# Patient Record
Sex: Female | Born: 1995 | Hispanic: Yes | Marital: Married | Smoking: Never smoker
Health system: Southern US, Community
[De-identification: ages and names within clinical notes are randomized; demographics above are authoritative.]

## PROBLEM LIST (undated history)

## (undated) ENCOUNTER — Emergency Department (HOSPITAL_COMMUNITY): Payer: BC Managed Care – PPO

## (undated) DIAGNOSIS — Z789 Other specified health status: Secondary | ICD-10-CM

## (undated) HISTORY — PX: KNEE ARTHROSCOPY: SHX127

## (undated) HISTORY — PX: KNEE SURGERY: SHX244

---

## 2020-01-03 ENCOUNTER — Emergency Department (HOSPITAL_COMMUNITY): Payer: No Typology Code available for payment source

## 2020-01-03 ENCOUNTER — Encounter (HOSPITAL_COMMUNITY): Payer: Self-pay | Admitting: Emergency Medicine

## 2020-01-03 ENCOUNTER — Other Ambulatory Visit: Payer: Self-pay

## 2020-01-03 ENCOUNTER — Emergency Department (HOSPITAL_COMMUNITY)
Admission: EM | Admit: 2020-01-03 | Discharge: 2020-01-03 | Disposition: A | Discharge status: Home / Self Care | Payer: No Typology Code available for payment source | Attending: Emergency Medicine | Admitting: Emergency Medicine

## 2020-01-03 DIAGNOSIS — Y999 Unspecified external cause status: Secondary | ICD-10-CM | POA: Insufficient documentation

## 2020-01-03 DIAGNOSIS — S60212A Contusion of left wrist, initial encounter: Secondary | ICD-10-CM | POA: Insufficient documentation

## 2020-01-03 DIAGNOSIS — Y92410 Unspecified street and highway as the place of occurrence of the external cause: Secondary | ICD-10-CM | POA: Diagnosis not present

## 2020-01-03 DIAGNOSIS — Y939 Activity, unspecified: Secondary | ICD-10-CM | POA: Insufficient documentation

## 2020-01-03 DIAGNOSIS — T1490XA Injury, unspecified, initial encounter: Secondary | ICD-10-CM

## 2020-01-03 DIAGNOSIS — S6992XA Unspecified injury of left wrist, hand and finger(s), initial encounter: Secondary | ICD-10-CM | POA: Diagnosis present

## 2020-01-03 LAB — POC URINE PREG, ED: Preg Test, Ur: NEGATIVE
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
## 2020-01-03 NOTE — ED Notes (Signed)
An After Visit Summary was printed and given to the patient. Discharge instructions given and no further questions at this time.  
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
## 2021-11-05 ENCOUNTER — Encounter (HOSPITAL_COMMUNITY): Payer: Self-pay | Admitting: Emergency Medicine

## 2021-11-05 ENCOUNTER — Emergency Department (HOSPITAL_COMMUNITY)
Admission: EM | Admit: 2021-11-05 | Discharge: 2021-11-06 | Disposition: A | Discharge status: Home / Self Care | Payer: BC Managed Care – PPO | Attending: Emergency Medicine | Admitting: Emergency Medicine

## 2021-11-05 ENCOUNTER — Other Ambulatory Visit: Payer: Self-pay

## 2021-11-05 DIAGNOSIS — N200 Calculus of kidney: Secondary | ICD-10-CM | POA: Diagnosis not present

## 2021-11-05 DIAGNOSIS — R109 Unspecified abdominal pain: Secondary | ICD-10-CM | POA: Diagnosis present

## 2021-11-05 DIAGNOSIS — N12 Tubulo-interstitial nephritis, not specified as acute or chronic: Secondary | ICD-10-CM

## 2021-11-05 DIAGNOSIS — D72829 Elevated white blood cell count, unspecified: Secondary | ICD-10-CM | POA: Insufficient documentation

## 2021-11-05 LAB — CBC WITH DIFFERENTIAL/PLATELET
Abs Immature Granulocytes: 0.06 10*3/uL (ref 0.00–0.07)
Basophils Absolute: 0.1 10*3/uL (ref 0.0–0.1)
Basophils Relative: 0 %
Eosinophils Absolute: 0.2 10*3/uL (ref 0.0–0.5)
Eosinophils Relative: 1 %
HCT: 39.7 % (ref 36.0–46.0)
Hemoglobin: 13.3 g/dL (ref 12.0–15.0)
Immature Granulocytes: 0 %
Lymphocytes Relative: 29 %
Lymphs Abs: 4.3 10*3/uL — ABNORMAL HIGH (ref 0.7–4.0)
MCH: 31.7 pg (ref 26.0–34.0)
MCHC: 33.5 g/dL (ref 30.0–36.0)
MCV: 94.5 fL (ref 80.0–100.0)
Monocytes Absolute: 1.1 10*3/uL — ABNORMAL HIGH (ref 0.1–1.0)
Monocytes Relative: 7 %
Neutro Abs: 9.1 10*3/uL — ABNORMAL HIGH (ref 1.7–7.7)
Neutrophils Relative %: 63 %
Platelets: 210 10*3/uL (ref 150–400)
RBC: 4.2 MIL/uL (ref 3.87–5.11)
RDW: 13.6 % (ref 11.5–15.5)
WBC: 14.8 10*3/uL — ABNORMAL HIGH (ref 4.0–10.5)
nRBC: 0 % (ref 0.0–0.2)

## 2021-11-05 LAB — COMPREHENSIVE METABOLIC PANEL
ALT: 15 U/L (ref 0–44)
AST: 17 U/L (ref 15–41)
Albumin: 4.6 g/dL (ref 3.5–5.0)
Alkaline Phosphatase: 51 U/L (ref 38–126)
Anion gap: 11 (ref 5–15)
BUN: 10 mg/dL (ref 6–20)
CO2: 24 mmol/L (ref 22–32)
Calcium: 9.7 mg/dL (ref 8.9–10.3)
Chloride: 106 mmol/L (ref 98–111)
Creatinine, Ser: 0.82 mg/dL (ref 0.44–1.00)
GFR, Estimated: >60 mL/min (ref >60)
Glucose, Bld: 93 mg/dL (ref 70–99)
Potassium: 4.2 mmol/L (ref 3.5–5.1)
Sodium: 141 mmol/L (ref 135–145)
Total Bilirubin: 0.5 mg/dL (ref 0.3–1.2)
Total Protein: 7.3 g/dL (ref 6.5–8.1)

## 2021-11-05 LAB — URINALYSIS, ROUTINE W REFLEX MICROSCOPIC
Bilirubin Urine: NEGATIVE
Glucose, UA: NEGATIVE mg/dL
Ketones, ur: NEGATIVE mg/dL
Nitrite: NEGATIVE
Protein, ur: NEGATIVE mg/dL
Specific Gravity, Urine: 1.002 — ABNORMAL LOW (ref 1.005–1.030)
pH: 7 (ref 5.0–8.0)

## 2021-11-05 LAB — I-STAT BETA HCG BLOOD, ED (MC, WL, AP ONLY): I-stat hCG, quantitative: <5 m[IU]/mL (ref <5)

## 2021-11-05 NOTE — ED Triage Notes (Signed)
Patient reports left lower back pain onset today , denies hematuria or dysuria , currently taking oral antibiotic for UTI.

## 2021-11-05 NOTE — ED Provider Triage Note (Signed)
Emergency Medicine Provider Triage Evaluation Note  Marie Weaver , a 26 y.o. female  was evaluated in triage.  Pt complains of left flank pain today, intermittent, sharp, severe at times with associated chills. Has also had urinary frequency/urgency x 1 week, prescribed abx for UTI (macrobid) picked this up today.  Review of Systems  Positive: Chills, flank pain, urgency, frequency, currently menstruating Negative: Vomiting   Physical Exam  BP (!) 140/104 (BP Location: Right Arm)   Pulse 99   Temp 98.3 F (36.8 C) (Oral)   Resp 16   LMP 11/02/2021   SpO2 100%  Gen:   Awake, no distress   Resp:  Normal effort  MSK:   Moves extremities without difficulty  Other:  Left CVA tenderness.   Medical Decision Making  Medically screening exam initiated at 10:56 PM.  Appropriate orders placed.  Oakleigh Mancillas was informed that the remainder of the evaluation will be completed by another provider, this initial triage assessment does not replace that evaluation, and the importance of remaining in the ED until their evaluation is complete.  Flank pain   Cherly Anderson, PA-C 11/05/21 2257

## 2021-11-06 ENCOUNTER — Emergency Department (HOSPITAL_COMMUNITY): Payer: BC Managed Care – PPO

## 2021-11-06 LAB — URINE CULTURE: Culture: <10000 — AB

## 2021-11-06 MED ORDER — ONDANSETRON 4 MG PO TBDP: 4.0000 mg | ORAL_TABLET | Freq: Three times a day (TID) | ORAL | 0 refills | Status: DC | PRN

## 2021-11-06 MED ORDER — KETOROLAC TROMETHAMINE 15 MG/ML IJ SOLN
15.0000 mg | Freq: Once | INTRAMUSCULAR | Status: AC
  Administered 2021-11-06: 15 mg via INTRAVENOUS
  Filled 2021-11-06: qty 1

## 2021-11-06 MED ORDER — SODIUM CHLORIDE 0.9 % IV SOLN
1.0000 g | Freq: Once | INTRAVENOUS | Status: AC
  Administered 2021-11-06: 1 g via INTRAVENOUS
  Filled 2021-11-06: qty 10

## 2021-11-06 MED ORDER — NAPROXEN 500 MG PO TABS: 500.0000 mg | ORAL_TABLET | Freq: Two times a day (BID) | ORAL | 0 refills | Status: DC | PRN

## 2021-11-06 MED ORDER — CEFPODOXIME PROXETIL 200 MG PO TABS: 200.0000 mg | ORAL_TABLET | Freq: Two times a day (BID) | ORAL | 0 refills | Status: DC

## 2021-11-06 NOTE — Discharge Instructions (Addendum)
You were seen in the emergency department today for left-sided back pain.  We suspect that you have pyelonephritis, please see attached handout for further information.  Your blood work showed that your white blood cell count was high which could indicate infection, your urine had findings of infection.  Your CT scan did not show a kidney stone, you do have some findings that could indicate constipation, take MiraLAX as needed for constipation, your CT scan also showed some slight disc bulging at L4-L5, please discuss with your primary care provider.  We are sending home with the following medicines: - Cefpodoxime: This is an antibiotic, please take as prescribed  Do not take the Macrobid with the cefpodoxime, please discontinue the Macrobid.  -Zofran: Take every hours as needed for nausea and vomiting  - Naproxen: this is a nonsteroidal anti-inflammatory medication that will help with pain and swelling. Be sure to take this medication as prescribed with food, 1 pill every 12 hours,  It should be taken with food, as it can cause stomach upset, and more seriously, stomach bleeding. Do not take other nonsteroidal anti-inflammatory medications with this such as Advil, Motrin, Aleve, Mobic, Goodie Powder, or Motrin etc..    You make take Tylenol per over the counter dosing with these medications.   We have prescribed you new medication(s) today. Discuss the medications prescribed today with your pharmacist as they can have adverse effects and interactions with your other medicines including over the counter and prescribed medications. Seek medical evaluation if you start to experience new or abnormal symptoms after taking one of these medicines, seek care immediately if you start to experience difficulty breathing, feeling of your throat closing, facial swelling, or rash as these could be indications of a more serious allergic reaction   We have cultured your urine, we will call you if the cefpodoxime  does not cover for your infection.  Please follow-up with your primary care provider within 3 days.  Return to the ER for any new or worsening symptoms including but not limited to new or worsening pain, fever, inability to keep fluids down, or any other concerns

## 2021-11-06 NOTE — ED Notes (Signed)
Patient verbalizes understanding of discharge instructions. Opportunity for questioning and answers were provided. Armband removed by staff, pt discharged from ED and ambulated to lobby to return home with SO.  

## 2021-11-06 NOTE — ED Provider Notes (Signed)
[facility] EMERGENCY DEPARTMENT Provider Note   CSN: [id number] Arrival date & time: 11/05/21  2229     History  Chief Complaint  Patient presents with   Back Pain    UTI    Marie Weaver is a 26 y.o. female without significant pmhx who presents to the ED with complaints of left flank pain that began earlier today, sharp, severe at times, waxing/waning, no alleviating factors. Also has had urgency/frequency x 1 week, evaluated and found to have UTI- received prescription for macrobid which she picked up today. Has had some chills as well. Denies fever, vomiting, diarrhea, or vaginal discharge. Currently menstruating.   HPI     Home Medications Prior to Admission medications   Not on File      Allergies    Patient has no known allergies.    Review of Systems   Review of Systems  Constitutional:  Negative for chills and fever.  Respiratory:  Negative for shortness of breath.   Cardiovascular:  Negative for chest pain.  Gastrointestinal:  Negative for abdominal pain, diarrhea, nausea and vomiting.  Genitourinary:  Positive for frequency, urgency and vaginal bleeding (menses). Negative for dysuria and vaginal discharge.  All other systems reviewed and are negative.   Physical Exam Updated Vital Signs BP (!) 136/100   Pulse 100   Temp 98.1 F (36.7 C)   Resp 17   LMP 11/02/2021   SpO2 100%  Physical Exam Vitals and nursing note reviewed.  Constitutional:      General: She is not in acute distress.    Appearance: She is well-developed. She is not toxic-appearing.  HENT:     Head: Normocephalic and atraumatic.  Eyes:     General:        Right eye: No discharge.        Left eye: No discharge.     Conjunctiva/sclera: Conjunctivae normal.  Cardiovascular:     Rate and Rhythm: Normal rate and regular rhythm.  Pulmonary:     Effort: No respiratory distress.     Breath sounds: Normal breath sounds. No wheezing or rales.  Abdominal:      General: There is no distension.     Palpations: Abdomen is soft.     Tenderness: There is no abdominal tenderness. There is left CVA tenderness. There is no right CVA tenderness, guarding or rebound.  Musculoskeletal:     Cervical back: Neck supple.  Skin:    General: Skin is warm and dry.  Neurological:     Mental Status: She is alert.     Comments: Clear speech. Strength & sensation grossly intact to bilateral lower extremities.   Psychiatric:        Behavior: Behavior normal.     ED Results / Procedures / Treatments   Labs (all labs ordered are listed, but only abnormal results are displayed) Labs Reviewed  CBC WITH DIFFERENTIAL/PLATELET - Abnormal; Notable for the following components:      Result Value   WBC 14.8 (*)    Neutro Abs 9.1 (*)    Lymphs Abs 4.3 (*)    Monocytes Absolute 1.1 (*)    All other components within normal limits  URINALYSIS, ROUTINE W REFLEX MICROSCOPIC - Abnormal; Notable for the following components:   Color, Urine STRAW (*)    APPearance HAZY (*)    Specific Gravity, Urine 1.002 (*)    Hgb urine dipstick LARGE (*)    Leukocytes,Ua LARGE (*)    Bacteria, UA  FEW (*)    All other components within normal limits  URINE CULTURE  COMPREHENSIVE METABOLIC PANEL  I-STAT BETA HCG BLOOD, ED ([facility], [facility], [facility] ONLY)    EKG None  Radiology CT Renal Stone Study  Result Date: 11/06/2021 CLINICAL DATA:  Flank pain, kidney stone suspected EXAM: CT ABDOMEN AND PELVIS WITHOUT CONTRAST TECHNIQUE: Multidetector CT imaging of the abdomen and pelvis was performed following the standard protocol without IV contrast. RADIATION DOSE REDUCTION: This exam was performed according to the departmental dose-optimization program which includes automated exposure control, adjustment of the mA and/or kV according to patient size and/or use of iterative reconstruction technique. COMPARISON:  None Available. FINDINGS: Lower chest: No acute airspace disease or pleural effusion.  Hepatobiliary: Subcentimeter fat density structure in the posterior right hepatic lobe, of doubtful clinical significance. No suspicious liver lesion. The gallbladder is decompressed. No calcified gallstone. No biliary dilatation. Pancreas: Unremarkable, portions are obscured due to noncontrast technique Spleen: Normal in size without focal abnormality. Adrenals/Urinary Tract: No adrenal nodule. No hydronephrosis or renal calculi. No perinephric edema. No evidence of focal renal lesion on this unenhanced exam. Both ureters are decompressed without stones along the course. There are multiple phleboliths in the pelvis that are outside the course of the ureter. The urinary bladder is partially distended, no bladder stone or wall thickening. Stomach/Bowel: Ingested material distends the stomach. There is no bowel obstruction or inflammation. Normal appendix visualized. Mild fecalization of distal small bowel contents. Moderate volume of stool in the colon. No colonic inflammation. No abnormal rectal distention. Vascular/Lymphatic: Normal caliber abdominal aorta. There is no bulky abdominopelvic adenopathy on this unenhanced exam. Reproductive: Anteverted uterus. The ovaries are symmetric in size, no adnexal mass. Small volume simple free fluid in the dependent pelvis. Other: Small volume pelvic free fluid, typically physiologic in a patient of this age. No upper abdominal ascites. No free air or focal fluid collection. Musculoskeletal: There are no acute or suspicious osseous abnormalities. Slight L4-S5 disc bulge. IMPRESSION: 1. No renal stones or obstructive uropathy. 2. Moderate colonic stool burden with fecalization of distal small bowel contents, suggesting slow transit/constipation. 3. Slight L4-L5 disc bulge Electronically Signed   By: Narda Rutherford M.D.   On: 11/06/2021 00:28    Procedures Procedures    Medications Ordered in ED Medications  cefTRIAXone (ROCEPHIN) 1 g in sodium chloride 0.9 % 100 mL  IVPB (1 g Intravenous New Bag/Given 11/06/21 0313)  ketorolac (TORADOL) 15 MG/ML injection 15 mg (15 mg Intravenous Given 11/06/21 9381)    ED Course/ Medical Decision Making/ A&P                           Medical Decision Making Amount and/or Complexity of Data Reviewed Labs: ordered. Radiology: ordered.  Risk Prescription drug management.   Patient presents to the ED with complaints of flank pain, this involves an extensive number of treatment options, and is a complaint that carries with it a high risk of complications and morbidity. Nontoxic, vitals w/ mildly elevated BP on arrival doubt HTN emergency. Abdomen nontender w/o peritoneal signs. Left CVA tenderness. No neuro deficits.   Ddx including but not limited to: pyelonephritis, kidney stone, MSK pain, dissection, torsion, ectopic pregnancy.   Additional history obtained:  Chart/nursing notes reviewed.   Lab Tests:  I viewed & interpreted labs including:  CBC: leukocytosis CMP: unremarkable.  UA: Concerning for infection- sent for culture Pregnancy test: Negative  Imaging Studies:  I ordered and  viewed the following imaging, agree with radiologist impression:  CT renal stone study: 1. No renal stones or obstructive uropathy. 2. Moderate colonic stool burden with fecalization of distal small bowel contents, suggesting slow transit/constipation. 3. Slight L4-L5 disc   ED Course:  I ordered medications including Rocephin to tx for pyelonephritis and toradol for pain.   Patient is feeling improved on re-assessment, appears appropriate for discharge. Stop macrobid, start cefpodoxime, additional supportive care provided. I discussed results, treatment plan, need for follow-up, and return precautions with the patient. Provided opportunity for questions, patient confirmed understanding and is in agreement with plan.    Portions of this note were generated with Lobbyist. Dictation errors may occur despite best  attempts at proofreading.   Final Clinical Impression(s) / ED Diagnoses Final diagnoses:  Pyelonephritis    Rx / DC Orders ED Discharge Orders          Ordered    cefpodoxime (VANTIN) 200 MG tablet  2 times daily        11/06/21 0345    naproxen (NAPROSYN) 500 MG tablet  2 times daily PRN        11/06/21 0345    ondansetron (ZOFRAN-ODT) 4 MG disintegrating tablet  Every 8 hours PRN        11/06/21 0345              Nestor Wieneke, Glynda Jaeger, PA-C 11/06/21 0405    Orpah Greek, MD 11/06/21 [id number]

## 2021-11-20 DIAGNOSIS — M545 Low back pain, unspecified: Secondary | ICD-10-CM | POA: Diagnosis not present

## 2021-11-20 DIAGNOSIS — M25551 Pain in right hip: Secondary | ICD-10-CM | POA: Insufficient documentation

## 2021-11-20 DIAGNOSIS — Z5321 Procedure and treatment not carried out due to patient leaving prior to being seen by health care provider: Secondary | ICD-10-CM | POA: Insufficient documentation

## 2021-11-21 ENCOUNTER — Other Ambulatory Visit: Payer: Self-pay

## 2021-11-21 ENCOUNTER — Emergency Department (HOSPITAL_COMMUNITY)
Admission: EM | Admit: 2021-11-21 | Discharge: 2021-11-21 | Discharge status: Against Medical Advice | Payer: BC Managed Care – PPO | Attending: Emergency Medicine | Admitting: Emergency Medicine

## 2021-11-21 ENCOUNTER — Encounter (HOSPITAL_COMMUNITY): Payer: Self-pay

## 2021-11-21 LAB — URINALYSIS, ROUTINE W REFLEX MICROSCOPIC
Bacteria, UA: NONE SEEN
Bilirubin Urine: NEGATIVE
Glucose, UA: NEGATIVE mg/dL
Hgb urine dipstick: NEGATIVE
Ketones, ur: NEGATIVE mg/dL
Leukocytes,Ua: NEGATIVE
Nitrite: NEGATIVE
Protein, ur: NEGATIVE mg/dL
Specific Gravity, Urine: 1.019 (ref 1.005–1.030)
pH: 7 (ref 5.0–8.0)

## 2021-11-21 LAB — PREGNANCY, URINE: Preg Test, Ur: NEGATIVE

## 2021-11-21 MED ORDER — OXYCODONE-ACETAMINOPHEN 5-325 MG PO TABS
1.0000 | ORAL_TABLET | Freq: Once | ORAL | Status: AC
  Administered 2021-11-21: 1 via ORAL
  Filled 2021-11-21: qty 1

## 2021-11-21 NOTE — ED Notes (Signed)
Pt left building, stated it was taking too long to be seen.

## 2021-11-21 NOTE — ED Triage Notes (Signed)
Lower back pain beginning today. Worse with sudden movements.   Denies dysuria.

## 2021-11-21 NOTE — ED Provider Triage Note (Signed)
  Emergency Medicine Provider Triage Evaluation Note  MRN:  [id number]  Arrival date & time: 11/21/21    Medically screening exam initiated at 12:14 AM.   CC:   Back Pain   HPI:  Marie Weaver is a 26 y.o. year-old female presents to the ED with chief complaint of low back pain for the past few days.  Reports some radiating pain into the right hip.  Denies urinary symptoms.  Recent CT shows L4/5 disc bulge.  She denies any trauma or injury.  Symptoms are worsened with movement and palpation.  History provided by patient. ROS:  -As included in HPI PE:   Vitals:   11/21/21 0011  BP: 109/88  Pulse: 77  Resp: 16  Temp: 98.7 F (37.1 C)  SpO2: 99%    Non-toxic appearing No respiratory distress  MDM:  Based on signs and symptoms, herniated disc is highest on my differential, followed by muscle strain. I've ordered UA in triage to expedite lab/diagnostic workup.  Patient was informed that the remainder of the evaluation will be completed by another provider, this initial triage assessment does not replace that evaluation, and the importance of remaining in the ED until their evaluation is complete.    Roxy Horseman, PA-C 11/21/21 0015

## 2021-12-15 ENCOUNTER — Other Ambulatory Visit: Payer: Self-pay

## 2021-12-15 ENCOUNTER — Encounter (HOSPITAL_COMMUNITY): Payer: Self-pay | Admitting: Obstetrics & Gynecology

## 2021-12-15 ENCOUNTER — Inpatient Hospital Stay (HOSPITAL_COMMUNITY): Payer: BC Managed Care – PPO

## 2021-12-15 ENCOUNTER — Inpatient Hospital Stay (HOSPITAL_COMMUNITY)
Admission: AD | Admit: 2021-12-15 | Discharge: 2021-12-15 | Disposition: A | Discharge status: Home / Self Care | Payer: BC Managed Care – PPO | Attending: Obstetrics & Gynecology | Admitting: Obstetrics & Gynecology

## 2021-12-15 DIAGNOSIS — O3680X0 Pregnancy with inconclusive fetal viability, not applicable or unspecified: Secondary | ICD-10-CM | POA: Diagnosis not present

## 2021-12-15 DIAGNOSIS — Z3A01 Less than 8 weeks gestation of pregnancy: Secondary | ICD-10-CM | POA: Insufficient documentation

## 2021-12-15 DIAGNOSIS — O26891 Other specified pregnancy related conditions, first trimester: Secondary | ICD-10-CM | POA: Diagnosis present

## 2021-12-15 DIAGNOSIS — O209 Hemorrhage in early pregnancy, unspecified: Secondary | ICD-10-CM | POA: Diagnosis not present

## 2021-12-15 HISTORY — DX: Other specified health status: Z78.9

## 2021-12-15 LAB — URINALYSIS, ROUTINE W REFLEX MICROSCOPIC
Bilirubin Urine: NEGATIVE
Glucose, UA: NEGATIVE mg/dL
Ketones, ur: 5 mg/dL — AB
Leukocytes,Ua: NEGATIVE
Nitrite: NEGATIVE
Protein, ur: 30 mg/dL — AB
Specific Gravity, Urine: 1.008 (ref 1.005–1.030)
pH: 6 (ref 5.0–8.0)

## 2021-12-15 LAB — CBC
HCT: 38.3 % (ref 36.0–46.0)
Hemoglobin: 13.1 g/dL (ref 12.0–15.0)
MCH: 31.3 pg (ref 26.0–34.0)
MCHC: 34.2 g/dL (ref 30.0–36.0)
MCV: 91.6 fL (ref 80.0–100.0)
Platelets: 209 10*3/uL (ref 150–400)
RBC: 4.18 MIL/uL (ref 3.87–5.11)
RDW: 13.4 % (ref 11.5–15.5)
WBC: 11.8 10*3/uL — ABNORMAL HIGH (ref 4.0–10.5)
nRBC: 0 % (ref 0.0–0.2)

## 2021-12-15 LAB — POCT PREGNANCY, URINE: Preg Test, Ur: POSITIVE — AB

## 2021-12-15 LAB — HCG, QUANTITATIVE, PREGNANCY: hCG, Beta Chain, Quant, S: 193 m[IU]/mL — ABNORMAL HIGH (ref <5)

## 2021-12-15 NOTE — MAU Note (Signed)
Marie Weaver is a 26 y.o. at [redacted]w[redacted]d here in MAU reporting: started having brown discharge yesterday. Started cramping this morning and then saw bleeding. States bleeding is now like a period and is wearing a pad.   LMP: 11/01/2021  Onset of complaint: yesterday  Pain score: 4/10  Vitals:   12/15/21 1310  BP: (!) 145/79  Pulse: 95  Resp: 16  Temp: 98.2 F (36.8 C)  SpO2: 99%     Lab orders placed from triage: ua, upt

## 2021-12-15 NOTE — MAU Provider Note (Signed)
History     CSN: [id number]  Arrival date and time: 12/15/21 1246   Event Date/Time   First Provider Initiated Contact with Patient 12/15/21 1457      Chief Complaint  Patient presents with   Vaginal Bleeding   Abdominal Pain   HPI Marie Weaver is a 26 y.o. G1P0 at [redacted]w[redacted]d by LMP who presents to MAU with chief complaints of abdominal pain and vaginal bleeding. These are new problems, onset yesterday 12/14/2021. Patient initially noted brown-tinged discharge, which turned to frank red bleeding this morning. Pain is suprapubic, pain score 4/10. She denies aggravating or alleviating factors. She denies dysuria, abdominal tenderness, fever or recent illness. She is remote from sexual intercourse.  OB History     Gravida  1   Para      Term      Preterm      AB      Living         SAB      IAB      Ectopic      Multiple      Live Births              Past Medical History:  Diagnosis Date   Medical history non-contributory     Past Surgical History:  Procedure Laterality Date   KNEE ARTHROSCOPY     KNEE SURGERY      No family history on file.  Social History   Tobacco Use   Smoking status: Never   Smokeless tobacco: Never  Substance Use Topics   Alcohol use: Never   Drug use: Never    Allergies: No Known Allergies  Medications Prior to Admission  Medication Sig Dispense Refill Last Dose   Prenatal Vit-Fe Fumarate-FA (PRENATAL MULTIVITAMIN) TABS tablet Take 1 tablet by mouth daily at 12 noon.      cefpodoxime (VANTIN) 200 MG tablet Take 1 tablet (200 mg total) by mouth 2 (two) times daily. (Patient not taking: Reported on 12/15/2021) 20 tablet 0 Not Taking   naproxen (NAPROSYN) 500 MG tablet Take 1 tablet (500 mg total) by mouth 2 (two) times daily as needed for moderate pain. (Patient not taking: Reported on 12/15/2021) 15 tablet 0 Not Taking   ondansetron (ZOFRAN-ODT) 4 MG disintegrating tablet Take 1 tablet (4 mg total) by mouth every 8 (eight)  hours as needed for nausea or vomiting. (Patient not taking: Reported on 12/15/2021) 5 tablet 0 Not Taking    Review of Systems  Gastrointestinal:  Positive for abdominal pain.  Genitourinary:  Positive for vaginal bleeding.  All other systems reviewed and are negative.  Physical Exam   Blood pressure (!) 145/79, pulse 95, temperature 98.2 F (36.8 C), temperature source Oral, resp. rate 16, height 5\' 2"  (1.575 m), weight 56.5 kg, last menstrual period 11/01/2021, SpO2 99 %.  Physical Exam Vitals and nursing note reviewed. Exam conducted with a chaperone present.  Constitutional:      Appearance: She is well-developed.  Cardiovascular:     Rate and Rhythm: Normal rate.     Heart sounds: Normal heart sounds.  Pulmonary:     Effort: Pulmonary effort is normal.  Abdominal:     General: Abdomen is flat.     Palpations: Abdomen is soft.  Genitourinary:    Comments: Deferred Skin:    Capillary Refill: Capillary refill takes less than 2 seconds.  Neurological:     Mental Status: She is alert and oriented to person, place, and time.  Psychiatric:  Mood and Affect: Mood normal.        Behavior: Behavior normal.     MAU Course  Procedures  MDM  --Patient with documented negative UPT 11/21/2021 --Discussed expected changes in quant hCG for miscarriage vs ectopic vs viable pregnancy  Orders Placed This Encounter  Procedures   US OB LESS THAN 14 WEEKS WITH OB TRANSVAGINAL   Urinalysis, Routine w reflex microscopic Urine, Clean Catch   CBC   hCG, quantitative, pregnancy   Pregnancy, urine POC   ABO/Rh   Discharge patient   Patient Vitals for the past 24 hrs:  BP Temp Temp src Pulse Resp SpO2 Height Weight  12/15/21 1526 128/77 -- -- 88 18 -- -- --  12/15/21 1310 (!) 145/79 98.2 F (36.8 C) Oral 95 16 99 % -- --  12/15/21 1306 -- -- -- -- -- -- 5\' 2"  (1.575 m) 56.5 kg   Results for orders placed or performed during the hospital encounter of 12/15/21 (from the past  24 hour(s))  Pregnancy, urine POC     Status: Abnormal   Collection Time: 12/15/21 12:55 PM  Result Value Ref Range   Preg Test, Ur POSITIVE (A) NEGATIVE  Urinalysis, Routine w reflex microscopic Urine, Clean Catch     Status: Abnormal   Collection Time: 12/15/21  1:01 PM  Result Value Ref Range   Color, Urine YELLOW YELLOW   APPearance HAZY (A) CLEAR   Specific Gravity, Urine 1.008 1.005 - 1.030   pH 6.0 5.0 - 8.0   Glucose, UA NEGATIVE NEGATIVE mg/dL   Hgb urine dipstick LARGE (A) NEGATIVE   Bilirubin Urine NEGATIVE NEGATIVE   Ketones, ur 5 (A) NEGATIVE mg/dL   Protein, ur 30 (A) NEGATIVE mg/dL   Nitrite NEGATIVE NEGATIVE   Leukocytes,Ua NEGATIVE NEGATIVE   RBC / HPF 11-20 0 - 5 RBC/hpf   WBC, UA 0-5 0 - 5 WBC/hpf   Bacteria, UA RARE (A) NONE SEEN   Squamous Epithelial / LPF 0-5 0 - 5   Mucus PRESENT   CBC     Status: Abnormal   Collection Time: 12/15/21  1:39 PM  Result Value Ref Range   WBC 11.8 (H) 4.0 - 10.5 K/uL   RBC 4.18 3.87 - 5.11 MIL/uL   Hemoglobin 13.1 12.0 - 15.0 g/dL   HCT 12/17/21 42.6 - 83.4 %   MCV 91.6 80.0 - 100.0 fL   MCH 31.3 26.0 - 34.0 pg   MCHC 34.2 30.0 - 36.0 g/dL   RDW 19.6 22.2 - 97.9 %   Platelets 209 150 - 400 K/uL   nRBC 0.0 0.0 - 0.2 %  hCG, quantitative, pregnancy     Status: Abnormal   Collection Time: 12/15/21  1:39 PM  Result Value Ref Range   hCG, Beta Chain, Quant, S 193 (H) <5 mIU/mL  ABO/Rh     Status: None   Collection Time: 12/15/21  1:39 PM  Result Value Ref Range   ABO/RH(D)      O POS Performed at [facility], [address], [place], [place]    [address] OB LESS THAN 14 WEEKS WITH US TRANSVAGINAL  Result Date: 12/15/2021 CLINICAL DATA:  Pregnant patient. Brown discharge for a day. Bleeding. EXAM: OBSTETRIC <14 WK 12/17/2021 AND TRANSVAGINAL OB US TECHNIQUE: Both transabdominal and transvaginal ultrasound examinations were performed for complete evaluation of the gestation as well as the maternal uterus, adnexal regions,  and pelvic cul-de-sac. Transvaginal technique was performed to assess early pregnancy.  COMPARISON:  None Available. FINDINGS: Intrauterine gestational sac: None Maternal uterus/adnexae: The ovaries are normal in appearance. IMPRESSION: 1. No IUP identified. The lack of an IUP in the setting of a positive pregnancy test may represent ectopic pregnancy, early pregnancy, or recent miscarriage. Recommend clinical correlation and close imaging and lab follow-up as clinically warranted. Electronically Signed   By: Gerome Sam III M.D.   On: 12/15/2021 14:26     Assessment and Plan  --26 y.o. G1P0 with pregnancy of unknown location --Quant hCG 193 --Hgb 13.1 -- Rh + --Discharge home in stable condition with bleeding/ectopic precautions  F/U: Appt made for repeat stat Quant hCG at [facility] Wednesday morning 12/18/2021  Calvert Cantor, MSA, MSN, CNM 12/15/2021, 4:37 PM

## 2021-12-16 LAB — ABO/RH: ABO/RH(D): O POS

## 2021-12-18 ENCOUNTER — Ambulatory Visit (INDEPENDENT_AMBULATORY_CARE_PROVIDER_SITE_OTHER): Payer: BC Managed Care – PPO

## 2021-12-18 ENCOUNTER — Other Ambulatory Visit: Payer: Self-pay

## 2021-12-18 VITALS — BP 134/83 | HR 83 | Ht 62.0 in | Wt 124.9 lb

## 2021-12-18 DIAGNOSIS — O3680X0 Pregnancy with inconclusive fetal viability, not applicable or unspecified: Secondary | ICD-10-CM

## 2021-12-18 LAB — BETA HCG QUANT (REF LAB): hCG Quant: 10 m[IU]/mL

## 2021-12-18 NOTE — Progress Notes (Signed)
Beta HCG Follow-up Visit  Marie Weaver presents to [facility] for follow-up beta HCG lab. She was seen in MAU for abdominal pain and vaginal bleeding on 12/15/2021. Patient denies pain, bleeding today. Only light brown discharge. Discussed with patient that we are following beta HCG levels today. Results will be back in approximately 2-3 hours. Valid contact number for patient confirmed. I will call the patient with results. Pt verbalized understanding and agreeable to plan of care.   Beta HCG results: 8/27 193  8/30 10      Results and patient history reviewed with Dr Crissie Reese, who states has failed pregnancy and to follow beta down to 0. Needs non-stat beta in 1 week. Patient called and informed of plan for follow-up. Call placed to pt. Spoke with patient. Pt given results and recommendations per Dr Crissie Reese. Pt verbalized understanding. Pt scheduled for non-stat beta on 9/6 at 0820am. Pt agreeable to date and time of appt. Pt also has follow up appt with Dr Langston Masker at [facility] on 12/26/21. Advised pt to keep this appt. Pt agreeable.  [name] 12/18/2021 8:37 AM

## 2021-12-24 ENCOUNTER — Other Ambulatory Visit: Payer: Self-pay

## 2021-12-24 DIAGNOSIS — O039 Complete or unspecified spontaneous abortion without complication: Secondary | ICD-10-CM

## 2021-12-25 ENCOUNTER — Other Ambulatory Visit: Payer: Self-pay

## 2021-12-25 ENCOUNTER — Other Ambulatory Visit: Payer: BC Managed Care – PPO

## 2021-12-25 DIAGNOSIS — O039 Complete or unspecified spontaneous abortion without complication: Secondary | ICD-10-CM

## 2021-12-28 LAB — BETA HCG QUANT (REF LAB): hCG Quant: <1 m[IU]/mL

## 2023-12-11 ENCOUNTER — Ambulatory Visit: Admission: EM | Admit: 2023-12-11 | Discharge: 2023-12-11 | Disposition: A | Discharge status: Home / Self Care

## 2023-12-11 ENCOUNTER — Encounter: Payer: Self-pay | Admitting: Emergency Medicine

## 2023-12-11 DIAGNOSIS — T2016XA Burn of first degree of forehead and cheek, initial encounter: Secondary | ICD-10-CM

## 2023-12-11 NOTE — ED Triage Notes (Signed)
 Pt has 1in burn to forehead that occurred yesterday around 7pm. Pt walked into mosquito bug zapper on a porch. Forehead had contact for just a second before she jumped back. Sharp pain at initial injury, but now just feels like burning discomfort. Had a headache this morning that resolved on it's own. Applied burn neosporin to area last night.

## 2023-12-11 NOTE — ED Provider Notes (Signed)
 [facility]    CSN: [id number] Arrival date & time: 12/11/23  9182      History   Chief Complaint Chief Complaint  Patient presents with   Facial Burn    HPI Marie Weaver is a 28 y.o. female.   Discussed the use of AI scribe software for clinical note transcription with the patient, who gave verbal consent to proceed.   The patient presents with a burn injury to the face. The incident occurred last night when the patient accidentally ran into a mosquito-repellent stick while being chased by her dog. The burn is located on the patient's forehead. The patient reports that the main area of concern did not blister. The patient applied Neosporin specifically for burns to the affected area. She was initially concerned about the severity of the burn, noting that she saw white on the skin immediately after the incident, which prompted consideration of an ER visit. The patient opted for Urgent Care instead. No other symptoms or concerns were reported.  The following portions of the patient's history were reviewed and updated as appropriate: allergies, current medications, past family history, past medical history, past social history, past surgical history, and problem list.      Past Medical History:  Diagnosis Date   Medical history non-contributory     There are no active problems to display for this patient.   Past Surgical History:  Procedure Laterality Date   KNEE ARTHROSCOPY     KNEE SURGERY      OB History     Gravida  1   Para      Term      Preterm      AB      Living         SAB      IAB      Ectopic      Multiple      Live Births               Home Medications    Prior to Admission medications   Not on File    Family History History reviewed. No pertinent family history.  Social History Social History   Tobacco Use   Smoking status: Never   Smokeless tobacco: Never  Vaping Use   Vaping status: Never Used   Substance Use Topics   Alcohol use: Never   Drug use: Never     Allergies   Patient has no known allergies.   Review of Systems Review of Systems  Skin:  Positive for wound.  All other systems reviewed and are negative.    Physical Exam Triage Vital Signs ED Triage Vitals [12/11/23 1042]  Encounter Vitals Group     BP 127/81     Girls Systolic BP Percentile      Girls Diastolic BP Percentile      Boys Systolic BP Percentile      Boys Diastolic BP Percentile      Pulse Rate 60     Resp 14     Temp 98.2 F (36.8 C)     Temp Source Oral     SpO2 98 %     Weight      Height      Head Circumference      Peak Flow      Pain Score 5     Pain Loc      Pain Education      Exclude from Growth Chart    No  data found.  Updated Vital Signs BP 127/81 (BP Location: Left Arm)   Pulse 60   Temp 98.2 F (36.8 C) (Oral)   Resp 14   LMP 12/08/2023 (Exact Date)   SpO2 98%   Breastfeeding No   Visual Acuity Right Eye Distance:   Left Eye Distance:   Bilateral Distance:    Right Eye Near:   Left Eye Near:    Bilateral Near:     Physical Exam Vitals reviewed.  Constitutional:      General: She is awake. She is not in acute distress.    Appearance: Normal appearance. She is well-developed. She is not ill-appearing, toxic-appearing or diaphoretic.  HENT:     Head: Normocephalic.     Right Ear: Hearing normal.     Left Ear: Hearing normal.     Nose: Nose normal.     Mouth/Throat:     Mouth: Mucous membranes are moist.  Eyes:     General: Vision grossly intact.     Conjunctiva/sclera: Conjunctivae normal.  Cardiovascular:     Rate and Rhythm: Normal rate and regular rhythm.     Heart sounds: Normal heart sounds.  Pulmonary:     Effort: Pulmonary effort is normal.     Breath sounds: Normal breath sounds and air entry.  Musculoskeletal:        General: Normal range of motion.     Cervical back: Full passive range of motion without pain, normal range of motion  and neck supple.  Skin:    General: Skin is warm and dry.     Findings: Burn present.     Comments: Mildly erythematous area noted to the forehead consistent with a superficial burn. There is no surrounding swelling, blistering, or drainage, and the skin surface appears dry without evidence of moistness. The area is minimally tender to palpation, with no signs of secondary infection (see image below)   Neurological:     General: No focal deficit present.     Mental Status: She is alert and oriented to person, place, and time.  Psychiatric:        Speech: Speech normal.        Behavior: Behavior is cooperative.      UC Treatments / Results  Labs (all labs ordered are listed, but only abnormal results are displayed) Labs Reviewed - No data to display  EKG   Radiology No results found.  Procedures Procedures (including critical care time)  Medications Ordered in UC Medications - No data to display  Initial Impression / Assessment and Plan / UC Course  I have reviewed the triage vital signs and the nursing notes.  Pertinent labs & imaging results that were available during my care of the patient were reviewed by me and considered in my medical decision making (see chart for details).     Assessment: The patient presents with a superficial burn to the forehead after accidental contact with a mosquito-repellent stick. The burn is consistent with a first-degree injury, as there is mild erythema without blistering, swelling, or signs of deeper tissue involvement. The patient was advised to keep the area clean and dry, gently wash with diluted antibacterial soap, and use cool compresses as needed for pain relief. Aloe vera gel may be applied for comfort, but ointments and creams should be avoided to reduce infection risk. Education was provided regarding monitoring for signs of infection, including increasing redness, swelling, pain, warmth, or drainage. Once healing occurs, the use of  vitamin E  oil was suggested to help minimize the risk of scarring. The patient was instructed to follow up with their primary care provider if the area does not improve within several days or if any concerns for infection arise, and to seek emergency care if symptoms worsen rapidly or systemic signs such as fever or spreading redness develop.  Today's evaluation has revealed no signs of a dangerous process. Discussed diagnosis with patient and/or guardian. Patient and/or guardian aware of their diagnosis, possible red flag symptoms to watch out for and need for close follow up. Patient and/or guardian understands verbal and written discharge instructions. Patient and/or guardian comfortable with plan and disposition.  Patient and/or guardian has a clear mental status at this time, good insight into illness (after discussion and teaching) and has clear judgment to make decisions regarding their care  Documentation was completed with the aid of voice recognition software. Transcription may contain typographical errors.  Final Clinical Impressions(s) / UC Diagnoses   Final diagnoses:  Superficial burn of forehead, initial encounter     Discharge Instructions      You were seen today for a superficial burn to your forehead caused by accidental contact with a mosquito-repellent stick. This is a first-degree burn, meaning only the top layer of skin is affected. To care for the burn, gently wash the area with mild antibacterial soap mixed with water and keep it clean and dry. You may use a cool compress for comfort and apply aloe vera gel if the area feels irritated or painful. Avoid applying heavy ointments, creams, or greasy substances as these can increase the risk of infection. As the burn heals, you may consider using vitamin E oil to help reduce the chance of scarring.  Monitor the area closely for signs of infection, which include increasing redness, swelling, warmth, pain, or drainage. Follow up  with your primary care provider if the burn does not begin to improve within several days or if you have any concerns about healing. Seek emergency care right away if the redness spreads rapidly, you develop fever, or the pain worsens significantly.      ED Prescriptions   None    PDMP not reviewed this encounter.   Iola Lukes, [place] 12/11/23 1154

## 2023-12-11 NOTE — Discharge Instructions (Addendum)
 You were seen today for a superficial burn to your forehead caused by accidental contact with a mosquito-repellent stick. This is a first-degree burn, meaning only the top layer of skin is affected. To care for the burn, gently wash the area with mild antibacterial soap mixed with water and keep it clean and dry. You may use a cool compress for comfort and apply aloe vera gel if the area feels irritated or painful. Avoid applying heavy ointments, creams, or greasy substances as these can increase the risk of infection. As the burn heals, you may consider using vitamin E oil to help reduce the chance of scarring.  Monitor the area closely for signs of infection, which include increasing redness, swelling, warmth, pain, or drainage. Follow up with your primary care provider if the burn does not begin to improve within several days or if you have any concerns about healing. Seek emergency care right away if the redness spreads rapidly, you develop fever, or the pain worsens significantly.
